# Patient Record
Sex: Male | Born: 1960 | Race: White | Hispanic: No | Marital: Married | Smoking: Never smoker
Health system: Southern US, Community
[De-identification: ages and names within clinical notes are randomized; demographics above are authoritative.]

## PROBLEM LIST (undated history)

## (undated) HISTORY — PX: RHINOPLASTY: SUR1284

---

## 2006-05-16 ENCOUNTER — Ambulatory Visit: Payer: Self-pay | Admitting: Gastroenterology

## 2007-03-15 ENCOUNTER — Encounter: Admission: RE | Admit: 2007-03-15 | Discharge: 2007-03-15 | Payer: Self-pay | Admitting: Orthopedic Surgery

## 2007-04-17 ENCOUNTER — Ambulatory Visit (HOSPITAL_COMMUNITY): Admission: RE | Admit: 2007-04-17 | Discharge: 2007-04-17 | Payer: Self-pay | Admitting: Orthopedic Surgery

## 2007-09-26 ENCOUNTER — Encounter: Admission: RE | Admit: 2007-09-26 | Discharge: 2007-09-26 | Payer: Self-pay | Admitting: Internal Medicine

## 2009-07-30 ENCOUNTER — Ambulatory Visit: Payer: Self-pay | Admitting: Diagnostic Radiology

## 2009-07-30 ENCOUNTER — Emergency Department (HOSPITAL_BASED_OUTPATIENT_CLINIC_OR_DEPARTMENT_OTHER): Admission: EM | Admit: 2009-07-30 | Discharge: 2009-07-31 | Payer: Self-pay | Admitting: Emergency Medicine

## 2010-05-02 LAB — URINE MICROSCOPIC-ADD ON

## 2010-05-02 LAB — COMPREHENSIVE METABOLIC PANEL
Alkaline Phosphatase: 69 U/L (ref 39–117)
CO2: 27 mEq/L (ref 19–32)
Calcium: 9.4 mg/dL (ref 8.4–10.5)
Chloride: 97 mEq/L (ref 96–112)
Sodium: 138 mEq/L (ref 135–145)
Total Bilirubin: 0.8 mg/dL (ref 0.3–1.2)
Total Protein: 7.2 g/dL (ref 6.0–8.3)

## 2010-05-02 LAB — CULTURE, BLOOD (ROUTINE X 2): Culture: NO GROWTH

## 2010-05-02 LAB — DIFFERENTIAL
Basophils Absolute: 0.1 10*3/uL (ref 0.0–0.1)
Eosinophils Relative: 0 % (ref 0–5)
Lymphs Abs: 0.6 10*3/uL — ABNORMAL LOW (ref 0.7–4.0)
Monocytes Relative: 7 % (ref 3–12)
Neutrophils Relative %: 86 % — ABNORMAL HIGH (ref 43–77)

## 2010-05-02 LAB — URINALYSIS, ROUTINE W REFLEX MICROSCOPIC
Bilirubin Urine: NEGATIVE
Glucose, UA: NEGATIVE mg/dL
Ketones, ur: 15 mg/dL — AB
Protein, ur: NEGATIVE mg/dL
pH: 7 (ref 5.0–8.0)

## 2010-05-02 LAB — CBC
Hemoglobin: 13.4 g/dL (ref 13.0–17.0)
RDW: 12.1 % (ref 11.5–15.5)

## 2010-05-02 LAB — ROCKY MTN SPOTTED FVR AB, IGM-BLOOD: RMSF IgM: 0.15 IV (ref 0.00–0.89)

## 2010-05-02 LAB — CK: Total CK: 411 U/L — ABNORMAL HIGH (ref 7–232)

## 2010-05-02 LAB — ROCKY MTN SPOTTED FVR AB, IGG-BLOOD: RMSF IgG: 0.08 IV

## 2010-05-02 LAB — URINE CULTURE

## 2010-06-29 NOTE — Op Note (Signed)
Corey Donaldson, Corey Donaldson                ACCOUNT NO.:  [id number]   MEDICAL RECORD NO.:  [id number]          PATIENT TYPE:  AMB   LOCATION:  SDS                          FACILITY:  [facility]   PHYSICIAN:  Almedia Balls. Ranell Patrick, M.D. DATE OF BIRTH:  11-21-1960   DATE OF PROCEDURE:  04/17/2007  DATE OF DISCHARGE:                               OPERATIVE REPORT   PREOPERATIVE DIAGNOSIS:  Right shoulder rotator cuff tear.   POSTOPERATIVE DIAGNOSES:  1. Right shoulder rotator cuff tear.  2. Right shoulder anterior labral tear.  3. Right shoulder impingement.   PROCEDURE:  Right shoulder arthroscopy with extensive intra-articular  debridement, including debridement of anterior labral tear, also  arthroscopic rotator interval release, followed by arthroscopic  subacromial decompression, mini-open rotator cuff repair.   SURGEON:  Almedia Balls. Ranell Patrick, M.D.   ASSISTANT:  Donnie Coffin. Dixon, P.A.-C.   ANESTHESIA:  General anesthesia plus an inter-Scalene block anesthesia  was used.   ESTIMATED BLOOD LOSS:  Minimal.   FLUIDS REPLACED:  Was 1500 mL of crystalloid.   INSTRUMENT COUNT:  Correct.   COMPLICATIONS:  None.   MEDICAL DECISION MAKING:  Perioperative antibiotics given.   INDICATIONS FOR PROCEDURE:  The patient is a 50 year old male with a  history of worsening right shoulder pain, who presents now with a  documented rotator cuff tear on MRI.  The patient presents now for  operative treatment, having failed conservative management.  An informed  consent was obtained.   DESCRIPTION OF PROCEDURE:  After an adequate level of anesthesia was  achieved, the patient was positioned in the modified beach chair  position.  All neurovascular structures were padded appropriately.  The  right shoulder was examined under anesthesia.  No induced stiffness was  noted, with a full passive range of motion of the shoulder.  Forward  flexion up to 180 degrees, abduction 95-100 degrees, external rotation  65  degrees, internal rotation about 45 degrees with the arm abducted.  Following the our examination under anesthesia, which showed no  instability, we went ahead and sterilely prepped and draped the right  shoulder in the usual manner.  We entered the shoulder arthroscopically  through the standard arthroscopic portals including anterior, posterior  and lateral portals. We identified hyperemia within the joint,  indicative of capsulitis.  We identified an anterior labral tear which  was debrided using a motorized shaver.  The remainder of the labrum was  intact, including anterior inferior labrum and the anterior band of the  inferior glenohumeral ligament.  The subscapularis is normal.  The  rotator cuff is torn.  This is a full thickness supraspinatus tear.  The  infraspinatus is normal.  The posterior portion of the cuff, including  the teres minor was normal.  The posterior labrum was intact.  There was  a lot of erythema but no significant thickening in the posterior  capsule.  I did not perform a capsule release.  Anteriorly we did  perform a rotator interval release due to some thickening in that  tissue.  At this point we placed the scope in the subacromial space  and  performed a thorough bursectomy and acromioplasty, creating a nice type  acromial shape.  We then completely decompressed the rotator cuff  outlet.  At this point we identified the rotator cuff tear from the  subacromial space, confirming the full thickness nature of the tear.  We  went ahead and concluded our arthroscopy and made a small mini-open  incision, starting at the anterior distal acromion and extending down  about 4 cm.  Dissection carried sharply out through subcutaneous  tissues.  We split the deltoid line with its fibers and the raphe  between the internal lateral heads.  We identified the torn rotator  cuff.  This was a U-shaped tear.  Immobilized it both in the bursal and  joint surfaces.  Placed a  margin convergent suture with #2 fiber wire  medially and then a single 5.5 Biocor screw anchor adjacent to the  articular cartilage and brought out the suture limbs through the  anterior posterior leaf of the tear.  Then repaired the rotator cuff  with first the margin convergent suture, then the two sutures.  They  were pre-loaded on the 5.5 Biocor screw anchor from [employer].  We had a  nice cuff repair.  We went ahead and placed two more fiber wire sutures  in a mattress fashion, to facilitate placement of push locks, to hold  down the lateral portion of the repair, and to apply that entire width  of the tendon against the freshened up greater tuberosity area, which we  did with a rongeur prior to placement of the anchor.  With these two  mattress sutures brought down over the side of the humerus, we were able  to use 4.5 bio-push locks out laterally, to further reinforce the  repair.  Thus we had an application of the medial portion of the foot  print and a lateral portion of the foot print or a double row-type  repair.  At this point we took the shoulder through a full range of  motion.  No impingement was noted.  We went ahead and thoroughly  irrigated and then closed the deltoid to itself with #0 Vicryl suture,  followed by #2-0 Vicryl subcutaneous closure and #4-0 Monocryl for skin  and Steri-Strips applied, followed by a sterile dressing.   The patient tolerated the surgery well.      Almedia Balls. Ranell Patrick, M.D.  Electronically Signed     SRN/MEDQ  D:  04/17/2007  T:  04/18/2007  Job:  [id number]

## 2010-07-02 NOTE — Assessment & Plan Note (Signed)
[facility]                         GASTROENTEROLOGY OFFICE NOTE   NAME:VESTALWilley, Due                       MRN:          [id number]  DATE:05/16/2006                            DOB:          [birth year]    REASON FOR CONSULTATION:  Dr. Alessandra Bevels asked me to evaluate Mr. Minotti in  consultation regarding right lower quadrant pain.   HISTORY OF PRESENT ILLNESS:  Mr. Ribaudo is a pleasant 50 year old man  who has had right lower quadrant pain for approximately six weeks.  He  describes them as a dull pain that tends to come and go throughout the  day, not related to eating, not related to moving his bowels.  Changing  positions does not seem to make the pain better or worse.  He has had no  nausea, vomiting, fever or chills.  He has had no constipation or rectal  bleeding.  No diarrhea.  He eventually presented to [facility] and had blood testing and imaging studies, including a CT  scan and some laboratory tests.  He tells me that these were all  essentially normal, except for a minor elevation in his liver tests.  (This has been a chronic issue for him.)  None of these tests are  available for our review here today.  We have called [facility] and they  will fax them as soon as possible.  He says he also had some blood tests  done by his primary care physician.  These tests are not available  either.  We are working to get those sent over as well.  The last blood  tests I have from him are from a complete metabolic profile in July  2007, which showed an ALT of 55, otherwise essentially normal.  A CBC  one year ago was normal as well.   REVIEW OF SYSTEMS:  Essentially negative and is available on his nursing  intake sheet.   PAST MEDICAL/SURGICAL HISTORY:  1. Left knee surgery.  2. Wisdom teeth pulled.  3. Elevated cholesterol.   CURRENT MEDICATIONS:  1. Crestor.  2. Testosterone.  3. Omega fish oil.  4. Magnesium.  5.  Cerefolin.  6. Zinc.  7. Iodine.  8. Silymarin.  9. Arimidex.  10.Aspirin.  11.Multivitamin.   ALLERGIES:  No known drug allergies.   SOCIAL HISTORY:  Married with one daughter.  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  Nonsmoker.  Drinks one cup of  wine a day.   PHYSICAL EXAMINATION:  VITAL SIGNS:  Height 5 feet 8 inches, weight 175  pounds, blood pressure 120/70, pulse 68.  GENERAL:  Well-appearing.  NEUROLOGIC:  Alert and oriented x3.  HEENT:  Eyes:  Extraocular movements intact.  Mouth:  Oropharynx moist,  no lesions.  NECK:  Supple, no lymphadenopathy.  CARDIOVASCULAR:  A regular rate and rhythm.  LUNGS:  Clear to auscultation bilaterally.  ABDOMEN:  Soft, nontender, non-distended.  Normal bowel sounds.  EXTREMITIES:  No lower extremity edema.  SKIN:  No rashes, lesions of the extremities.   ASSESSMENT/PLAN:  A 50 year old  man with right lower quadrant discomfort   Recent laboratory tests and imaging studies were normal, according to  the patient.  We are working to get these sent over from his primary  care physician in [facility].  I do, however, trust  that they were normal for now, especially since on examination he is  only very, very mildly tender in the right lower quadrant.  He has had  no fever or chills, to suggest a significant infection or inflammation.  He has had no change in his bowels to suggest a primary GI inflammatory  condition as well.  He is on many over-the-counter medicines, most of  which I am not familiar with their side effect profile.  Arimidex is  known to cause pains, arthritis and arthralgias,  and that may be  perhaps contributing to some of his symptoms now.  I would not be able  to explain why it is helping now, when he has been on the medicine for  at least one year or so.  I recommended he consider cutting back on some  of these, unless they are absolutely necessary.  He will discuss  this  with his primary care physician.  I will review the laboratory tests and  imaging studies as they are sent here.  He will return to see me in two  to three weeks' time.  He knows to contact me if anything has changed,  so I think my first plan is that if his pain dramatically increases,  will repeat the CT scan, and to get a new set of CBC and complete  metabolic profile performed.  Otherwise I think you should just continue  his usual routine besides perhaps cutting back on some non-essential  medicines.     Rachael Fee, MD  Electronically Signed    DPJ/MedQ  DD: 05/16/2006  DT: 05/16/2006  Job #: [id number]

## 2010-11-08 LAB — BASIC METABOLIC PANEL
BUN: 13
CO2: 30
Calcium: 10.1
Chloride: 105
Creatinine, Ser: 1.05
Glucose, Bld: 99
Potassium: 4
Sodium: 142

## 2010-11-08 LAB — DIFFERENTIAL
Basophils Absolute: 0
Basophils Relative: 0
Eosinophils Relative: 1
Lymphocytes Relative: 24
Monocytes Absolute: 0.4
Monocytes Relative: 10

## 2010-11-08 LAB — CBC
HCT: 46.6
Platelets: 201
WBC: 4.4

## 2010-11-08 LAB — APTT: aPTT: 29

## 2010-11-08 LAB — ABO/RH: ABO/RH(D): A POS

## 2010-11-08 LAB — TYPE AND SCREEN
ABO/RH(D): A POS
Antibody Screen: NEGATIVE

## 2010-11-08 LAB — URINALYSIS, ROUTINE W REFLEX MICROSCOPIC: Specific Gravity, Urine: 1.026

## 2013-08-21 ENCOUNTER — Ambulatory Visit
Admission: RE | Admit: 2013-08-21 | Discharge: 2013-08-21 | Disposition: A | Discharge status: Home / Self Care | Payer: PRIVATE HEALTH INSURANCE | Source: Ambulatory Visit | Attending: Internal Medicine | Admitting: Internal Medicine

## 2013-08-21 ENCOUNTER — Other Ambulatory Visit: Payer: Self-pay | Admitting: Internal Medicine

## 2013-08-21 DIAGNOSIS — M25512 Pain in left shoulder: Secondary | ICD-10-CM

## 2013-09-16 ENCOUNTER — Other Ambulatory Visit: Payer: Self-pay | Admitting: Internal Medicine

## 2013-09-16 DIAGNOSIS — M25512 Pain in left shoulder: Secondary | ICD-10-CM

## 2013-09-26 ENCOUNTER — Ambulatory Visit
Admission: RE | Admit: 2013-09-26 | Discharge: 2013-09-26 | Disposition: A | Discharge status: Home / Self Care | Payer: PRIVATE HEALTH INSURANCE | Source: Ambulatory Visit | Attending: Internal Medicine | Admitting: Internal Medicine

## 2013-09-26 DIAGNOSIS — M25512 Pain in left shoulder: Secondary | ICD-10-CM

## 2013-09-26 MED ORDER — IOHEXOL 180 MG/ML  SOLN: 15.0000 mL | Freq: Once | INTRAMUSCULAR | Status: AC | PRN

## 2014-04-01 ENCOUNTER — Ambulatory Visit (INDEPENDENT_AMBULATORY_CARE_PROVIDER_SITE_OTHER): Payer: PRIVATE HEALTH INSURANCE | Admitting: Family Medicine

## 2014-04-01 ENCOUNTER — Encounter: Payer: Self-pay | Admitting: Family Medicine

## 2014-04-01 ENCOUNTER — Ambulatory Visit (INDEPENDENT_AMBULATORY_CARE_PROVIDER_SITE_OTHER)
Admission: RE | Admit: 2014-04-01 | Discharge: 2014-04-01 | Disposition: A | Discharge status: Home / Self Care | Payer: PRIVATE HEALTH INSURANCE | Source: Ambulatory Visit | Attending: Family Medicine | Admitting: Family Medicine

## 2014-04-01 ENCOUNTER — Other Ambulatory Visit (INDEPENDENT_AMBULATORY_CARE_PROVIDER_SITE_OTHER): Payer: PRIVATE HEALTH INSURANCE

## 2014-04-01 VITALS — BP 142/86 | HR 95 | Ht 68.0 in | Wt 187.0 lb

## 2014-04-01 DIAGNOSIS — R0781 Pleurodynia: Secondary | ICD-10-CM

## 2014-04-01 DIAGNOSIS — S20212A Contusion of left front wall of thorax, initial encounter: Secondary | ICD-10-CM | POA: Insufficient documentation

## 2014-04-01 MED ORDER — HYDROCODONE-ACETAMINOPHEN 7.5-325 MG PO TABS: 1.0000 | ORAL_TABLET | Freq: Three times a day (TID) | ORAL | Status: DC | PRN

## 2014-04-01 NOTE — Progress Notes (Signed)
  Tawana ScaleZach Smith D.O. [facility] [address] [place], [place] [address] Phone: [phone] Subjective:    I'm seeing this patient by the request  of:  Dr. Alessandra BevelsVaughn  CC: fell and hurt ribs.    BJY:NWGNFAOZHYHPI:Subjective Corey Donaldson is a 54 y.o. male coming in with complaint of rib pain after fall. Patient did slip on the ice. Patient fell onto his left side. Patient had some difficulty breathing.  Patient does state that this happened last night. With going upstairs and fell directly on his left side. Patient states that he was a significant amount of pain immediately. Patient has been doing on a could gel and taking some over-the-counter anti-inflammatories. Patient states that the pain is tolerable but very difficult to sleep. Patient only slept approximate 1-2 hours at night. Denies any bruising states the pain seems to be mostly in the midportion around to the anterior aspect with less pain on the posterior aspect.    patient did have x-rays of the left side of his ribs. Patient's x-rays of the wrist do not show any significant bony abnormality with over read pending.  Past medical history, social, surgical and family history all reviewed in electronic medical record.   Review of Systems: No headache, visual changes, nausea, vomiting, diarrhea, constipation, dizziness, abdominal pain, skin rash, fevers, chills, night sweats, weight loss, swollen lymph nodes, body aches, joint swelling, muscle aches, chest pain, shortness of breath, mood changes.   Objective There were no vitals taken for this visit.  General: No apparent distress alert and oriented x3 mood and affect normal, dressed appropriately.  HEENT: Pupils equal, extraocular movements intact  Respiratory: Patient's speak in full sentences and does not appear short of breath  Cardiovascular: No lower extremity edema, non tender, no erythema  Skin: Warm dry intact with no signs of infection or rash on extremities or on axial skeleton.   Abdomen: Soft nontender  Neuro: Cranial nerves II through XII are intact, neurovascularly intact in all extremities with 2+ DTRs and 2+ pulses.  Lymph: No lymphadenopathy of posterior or anterior cervical chain or axillae bilaterally.  Gait normal with good balance and coordination.  MSK:  Non tender with full range of motion and good stability and symmetric strength and tone of shoulders, elbows, wrist, hip, knee and ankles bilaterally.  Rib exam shows  very minimal dislocation but no significant bony abnormality noted of the left side rib cage. Patient though is tender to palpation mostly over the ninth through 11th rib mostly on the axillary line. No crepitus noted. No significant bruising or skin changes noted. Neurovascularly intact surrounding the area with no signs or rash. No flank pain or bruising noted.  Limited musculoskeletal ultrasound was performed and interpreted by Antoine PrimasSMITH, ZACHARY, M  Limited ultrasound shows the patient is having no bony abnormality noted. Patient has some mild hypoechoic changes of the soft tissue in the area. Impression: rib contusion with no break.      Impression and Recommendations:     This case required medical decision making of moderate complexity.

## 2014-04-01 NOTE — Patient Instructions (Addendum)
Good to meet you You are going to be in pain for about 1-2 weeks.  Ice 20 minutes 4-5 times daily.  Try the topical medicine up to 2 times daily.  Take 10 deep breaths every 2 hours.  We will try a rib brace but you need to do the breathing.  Pennsaid twice daily Norco if you need it.  See me again in 1-2 weeks.

## 2014-04-01 NOTE — Assessment & Plan Note (Signed)
No fracture seen Put in rib belt Topical NSAIDs Pain medicines given in case of breakthrough Discussed deep breathing exercises Patient will return in 10 days to make sure he is improving significant doing.

## 2014-04-01 NOTE — Progress Notes (Signed)
Pre visit review using our clinic review tool, if applicable. No additional management support is needed unless otherwise documented below in the visit note. 

## 2014-04-10 ENCOUNTER — Encounter: Payer: Self-pay | Admitting: Family Medicine

## 2014-04-10 ENCOUNTER — Ambulatory Visit (INDEPENDENT_AMBULATORY_CARE_PROVIDER_SITE_OTHER): Payer: PRIVATE HEALTH INSURANCE | Admitting: Family Medicine

## 2014-04-10 VITALS — BP 140/84 | HR 108 | Ht 68.0 in | Wt 184.0 lb

## 2014-04-10 DIAGNOSIS — S20212D Contusion of left front wall of thorax, subsequent encounter: Secondary | ICD-10-CM

## 2014-04-10 DIAGNOSIS — M67912 Unspecified disorder of synovium and tendon, left shoulder: Secondary | ICD-10-CM | POA: Insufficient documentation

## 2014-04-10 MED ORDER — NITROGLYCERIN 0.2 MG/HR TD PT24: MEDICATED_PATCH | TRANSDERMAL | Status: DC

## 2014-04-10 NOTE — Progress Notes (Signed)
Pre visit review using our clinic review tool, if applicable. No additional management support is needed unless otherwise documented below in the visit note. 

## 2014-04-10 NOTE — Assessment & Plan Note (Addendum)
She does have some rotator cuff tendinopathy overall. I do think that actually icing regimen and home exercises will be beneficial with patient has already been doing. Patient will be started on nitroglycerin patches. Patient continues to have difficulty we can repeat PRP. We will discuss with patient again in 3 weeks to make sure that he is doing relatively well with no side effects to the treatment.  Spent  25 minutes with patient face-to-face and had greater than 50% of counseling including as described above in assessment and plan.

## 2014-04-10 NOTE — Progress Notes (Signed)
  Tawana ScaleZach Glenden Rossell D.O. [facility] [address] [place], [place] [address] Phone: [phone]([phone] Subjective:     CC: fell and hurt ribs.    BJY:NWGNFAOZHYHPI:Subjective Corey D Lance MorinVestal is a 54 y.o. male coming in with complaint of rib pain after fall. Patient did slip on the ice multiple weeks ago. Patient was seen previously and had more of a rib contusion with a questionable 10th rib fracture noted on x-ray. Patient states rib is feeling better, still uncomfortable. No radiation decrease pain meds to nothing in last 2 days.   Patient is also complaining of left shoulder pain. Patient did have a rotator cuff tear that has been treated conservatively. Patient has had 4 rounds of PRP injection already. Patient states after this fall unfortunately is having worsening pain again. Denies any weakness though. Patient patient denies any radiation. Rates the severity of pain up proximally 4 out of 10. Patient is not doing any increase activity secondary to the pain.     patient did have x-rays of the left side of his ribs. Patient's x-rays of the wrist do not show any significant bony abnormality with over read pending.  Past medical history, social, surgical and family history all reviewed in electronic medical record.   Review of Systems: No headache, visual changes, nausea, vomiting, diarrhea, constipation, dizziness, abdominal pain, skin rash, fevers, chills, night sweats, weight loss, swollen lymph nodes, body aches, joint swelling, muscle aches, chest pain, shortness of breath, mood changes.   Objective Blood pressure 140/84, pulse 108, height 5\' 8"  (1.727 m), weight 184 lb (83.462 kg), SpO2 97 %.  General: No apparent distress alert and oriented x3 mood and affect normal, dressed appropriately.  HEENT: Pupils equal, extraocular movements intact  Respiratory: Patient's speak in full sentences and does not appear short of breath  Cardiovascular: No lower extremity edema, non tender, no erythema  Skin:  Warm dry intact with no signs of infection or rash on extremities or on axial skeleton.  Abdomen: Soft nontender  Neuro: Cranial nerves II through XII are intact, neurovascularly intact in all extremities with 2+ DTRs and 2+ pulses.  Lymph: No lymphadenopathy of posterior or anterior cervical chain or axillae bilaterally.  Gait normal with good balance and coordination.  MSK:  Non tender with full range of motion and good stability and symmetric strength and tone of  elbows, wrist, hip, knee and ankles bilaterally.  Rib exam shows patient is still somewhat tender over the 10th rib on the left side. There is no crepitus noted. Significant improvement from previous. No discoloration noted. Shoulder: Left Inspection reveals no abnormalities, atrophy or asymmetry. Palpation is normal with no tenderness over AC joint or bicipital groove. ROM is full in all planes. Rotator cuff strength 4 out of 5 compared to 5 out of 5 on the contralateral side Positive signs of impingement Speeds and Yergason's tests normal. No labral pathology noted with negative Obrien's, negative clunk and good stability. Normal scapular function observed. No painful arc and no drop arm sign. No apprehension sign           Impression and Recommendations:     This case required medical decision making of moderate complexity.

## 2014-04-10 NOTE — Assessment & Plan Note (Signed)
Patient is doing very well with the healing of the rib and is still approximately 2 weeks out from being pain-free. Discussed continuing the icing regimen as well as has pain medications when needed. Patient will start to increase his activity slowly over the course of the next 2 weeks.

## 2014-04-10 NOTE — Patient Instructions (Addendum)
Good to see you Ice is your friend still  I think you are a couple weeks away form being pain free Nitroglycerin Protocol   Apply 1/4 nitroglycerin patch to affected area daily.  Change position of patch within the affected area every 24 hours.  You may experience a headache during the first 1-2 weeks of using the patch, these should subside.  If you experience headaches after beginning nitroglycerin patch treatment, you may take your preferred over the counter pain reliever.  Another side effect of the nitroglycerin patch is skin irritation or rash related to patch adhesive.  Please notify our office if you develop more severe headaches or rash, and stop the patch.  Tendon healing with nitroglycerin patch may require 12 to 24 weeks depending on the extent of injury.  Men should not use if taking Viagra, Cialis, or Levitra.   Do not use if you have migraines or rosacea.   Check in  3 weeks.

## 2014-04-28 ENCOUNTER — Ambulatory Visit (INDEPENDENT_AMBULATORY_CARE_PROVIDER_SITE_OTHER): Payer: PRIVATE HEALTH INSURANCE | Admitting: Family Medicine

## 2014-04-28 ENCOUNTER — Encounter: Payer: Self-pay | Admitting: Family Medicine

## 2014-04-28 VITALS — BP 136/82 | HR 87 | Ht 68.0 in | Wt 184.0 lb

## 2014-04-28 DIAGNOSIS — M24559 Contracture, unspecified hip: Secondary | ICD-10-CM

## 2014-04-28 DIAGNOSIS — M9902 Segmental and somatic dysfunction of thoracic region: Secondary | ICD-10-CM

## 2014-04-28 DIAGNOSIS — M9903 Segmental and somatic dysfunction of lumbar region: Secondary | ICD-10-CM

## 2014-04-28 DIAGNOSIS — S20212D Contusion of left front wall of thorax, subsequent encounter: Secondary | ICD-10-CM

## 2014-04-28 DIAGNOSIS — M999 Biomechanical lesion, unspecified: Secondary | ICD-10-CM

## 2014-04-28 DIAGNOSIS — M9904 Segmental and somatic dysfunction of sacral region: Secondary | ICD-10-CM

## 2014-04-28 DIAGNOSIS — S298XXD Other specified injuries of thorax, subsequent encounter: Secondary | ICD-10-CM

## 2014-04-28 DIAGNOSIS — M67912 Unspecified disorder of synovium and tendon, left shoulder: Secondary | ICD-10-CM

## 2014-04-28 MED ORDER — NITROGLYCERIN 0.2 MG/HR TD PT24: MEDICATED_PATCH | TRANSDERMAL | Status: DC

## 2014-04-28 NOTE — Assessment & Plan Note (Signed)
Doing well, continue nitro

## 2014-04-28 NOTE — Progress Notes (Signed)
Tawana ScaleZach Smith D.O. [facility] [address] [place], [place] [address] Phone: [phone] Subjective:     CC: fell and hurt ribs.    BJY:NWGNFAOZHYHPI:Subjective Corey D Lance MorinVestal is a 54 y.o. male coming in with complaint of rib pain after fall. Patient did slip on the ice multiple weeks ago. Patient was seen previously and had more of a rib contusion with a questionable 10th rib fracture noted on x-ray. Patient states that overall he is doing significantly better. Not affecting his daily activities.  Patient is also complaining of left shoulder pain. Patient did have a rotator cuff tear that has been treated conservatively. Patient has had 4 rounds of PRP injection already. Patient states after this fall unfortunately is having worsening pain again.  Patient was given the nitroglycerin patches. Patient has not notice any significant improvement but it Diffley has not got worse which patient is happy with. Patient denies any numbness or tingling down the arm.  Mild lower back pain. Patient states it is more of a dull throbbing aching pain. Denies any radiation down the legs. Patient has had this trouble for quite some time and has noticed though that last 2 days gotten worse can see was doing a lot of yard work.     patient did have x-rays of the left side of his ribs. Patient's x-rays of the wrist do not show any significant bony abnormality with over read pending.  Past medical history, social, surgical and family history all reviewed in electronic medical record.   Review of Systems: No headache, visual changes, nausea, vomiting, diarrhea, constipation, dizziness, abdominal pain, skin rash, fevers, chills, night sweats, weight loss, swollen lymph nodes, body aches, joint swelling, muscle aches, chest pain, shortness of breath, mood changes.   Objective Blood pressure 136/82, pulse 87, height 5\' 8"  (1.727 m), weight 184 lb (83.462 kg), SpO2 98 %.  General: No apparent distress alert and  oriented x3 mood and affect normal, dressed appropriately.  HEENT: Pupils equal, extraocular movements intact  Respiratory: Patient's speak in full sentences and does not appear short of breath  Cardiovascular: No lower extremity edema, non tender, no erythema  Skin: Warm dry intact with no signs of infection or rash on extremities or on axial skeleton.  Abdomen: Soft nontender  Neuro: Cranial nerves II through XII are intact, neurovascularly intact in all extremities with 2+ DTRs and 2+ pulses.  Lymph: No lymphadenopathy of posterior or anterior cervical chain or axillae bilaterally.  Gait normal with good balance and coordination.  MSK:  Non tender with full range of motion and good stability and symmetric strength and tone of  elbows, wrist, hip, knee and ankles bilaterally.  Rib exam shows patient is still somewhat tender over the 10th rib on the left side. There is no crepitus noted. Significant improvement from previous. No discoloration noted. Shoulder: Left Inspection reveals no abnormalities, atrophy or asymmetry. Palpation is normal with no tenderness over AC joint or bicipital groove. ROM is full in all planes. Rotator cuff strength 4 out of 5 compared to 5 out of 5 on the contralateral side Mild impingement sign still Speeds and Yergason's tests normal. No labral pathology noted with negative Obrien's, negative clunk and good stability. Normal scapular function observed. No painful arc and no drop arm sign. No apprehension sign   Back Exam:  Inspection: Unremarkable  Motion: Flexion 45 deg, Extension 45 deg, Side Bending to 45 deg bilaterally,  Rotation to 45 deg bilaterally  SLR laying: Negative  XSLR laying: Negative  Palpable tenderness: None. FABER: negative. Sensory change: Gross sensation intact to all lumbar and sacral dermatomes.  Reflexes: 2+ at both patellar tendons, 2+ at achilles tendons, Babinski's downgoing.  Strength at foot  Plantar-flexion: 5/5  Dorsi-flexion: 5/5 Eversion: 5/5 Inversion: 5/5  Leg strength  Quad: 5/5 Hamstring: 5/5 Hip flexor: 5/5 Hip abductors: 5/5  Gait unremarkable.   Osteophytic findings T3 extended rotated and side bent right  T8 extended rotated and side bent left  L2 flexed rotated inside that right  Sacrum left on left     Impression and Recommendations:     This case required medical decision making of moderate complexity.

## 2014-04-28 NOTE — Assessment & Plan Note (Signed)
Near healed

## 2014-04-28 NOTE — Progress Notes (Signed)
Pre visit review using our clinic review tool, if applicable. No additional management support is needed unless otherwise documented below in the visit note. 

## 2014-04-28 NOTE — Assessment & Plan Note (Signed)
Patient's back pain is secondary to his tight hip flexors. We discussed icing regimen and home exercises. Does icing and will do the home exercises. Patient will come back in 3 weeks for further evaluation. Patient did respond well to osteopathic manipulation.

## 2014-04-28 NOTE — Patient Instructions (Signed)
Good to see you Ice is your friend The rib is good to go Continue the patch daily still for another 2 weeks For your back consider some stretches Ice is still good See me again in 3 weeks if the manipulation helps

## 2014-05-29 ENCOUNTER — Ambulatory Visit (INDEPENDENT_AMBULATORY_CARE_PROVIDER_SITE_OTHER): Payer: PRIVATE HEALTH INSURANCE | Admitting: Family Medicine

## 2014-05-29 ENCOUNTER — Encounter: Payer: Self-pay | Admitting: Family Medicine

## 2014-05-29 VITALS — BP 152/98 | HR 100 | Ht 68.0 in | Wt 183.0 lb

## 2014-05-29 DIAGNOSIS — M24559 Contracture, unspecified hip: Secondary | ICD-10-CM | POA: Diagnosis not present

## 2014-05-29 DIAGNOSIS — M67912 Unspecified disorder of synovium and tendon, left shoulder: Secondary | ICD-10-CM | POA: Diagnosis not present

## 2014-05-29 DIAGNOSIS — M9903 Segmental and somatic dysfunction of lumbar region: Secondary | ICD-10-CM | POA: Diagnosis not present

## 2014-05-29 DIAGNOSIS — M9902 Segmental and somatic dysfunction of thoracic region: Secondary | ICD-10-CM

## 2014-05-29 DIAGNOSIS — M999 Biomechanical lesion, unspecified: Secondary | ICD-10-CM

## 2014-05-29 DIAGNOSIS — M9904 Segmental and somatic dysfunction of sacral region: Secondary | ICD-10-CM | POA: Diagnosis not present

## 2014-05-29 NOTE — Patient Instructions (Addendum)
Good to see you Conitnue the exercises Stop the selenium Continue the nitro See me when you need it

## 2014-05-29 NOTE — Progress Notes (Signed)
[name] [address] [place], [place] [address] Phone: [phone] Subjective:     CC:  back pain follow   LKG:[id number] Corey Donaldson is a 54 y.o. male coming in follow-up of low back pain. Patient had more muscle imbalances as well as tight hip flexors. Patient was given home exercises and did respond well to osteopathic manipulation. Patient states  Patient is also complaining of left shoulder pain. Patient did have a rotator cuff tear that has been treated conservatively. Patient has had 4 rounds of PRP injection already. Patient continue with the nitroglycerin as well as the home exercises. Patient states continues to do better. Patient is looking forward to possibly lifting at this time.  Mild lower back pain. Patient states it is more of a dull throbbing aching pain. Denies any radiation down the legs. Patient has had this trouble for quite some time and has noticed though that last 2 days gotten worse can see was doing a lot of yard work.      Past medical history, social, surgical and family history all reviewed in electronic medical record.   Review of Systems: No headache, visual changes, nausea, vomiting, diarrhea, constipation, dizziness, abdominal pain, skin rash, fevers, chills, night sweats, weight loss, swollen lymph nodes, body aches, joint swelling, muscle aches, chest pain, shortness of breath, mood changes.   Objective Blood pressure 152/98, pulse 100, height  (1.727 m), weight 183 lb (83.008 kg), SpO2 99 %.  General: No apparent distress alert and oriented x3 mood and affect normal, dressed appropriately.  HEENT: Pupils equal, extraocular movements intact  Respiratory: Patient's speak in full sentences and does not appear short of breath  Cardiovascular: No lower extremity edema, non tender, no erythema  Skin: Warm dry intact with no signs of infection or rash on extremities or on axial skeleton.  Abdomen: Soft nontender    Neuro: Cranial nerves II through XII are intact, neurovascularly intact in all extremities with 2+ DTRs and 2+ pulses.  Lymph: No lymphadenopathy of posterior or anterior cervical chain or axillae bilaterally.  Gait normal with good balance and coordination.  MSK:  Non tender with full range of motion and good stability and symmetric strength and tone of  elbows, wrist, hip, knee and ankles bilaterally.   Shoulder: Left Inspection reveals no abnormalities, atrophy or asymmetry. Palpation is normal with no tenderness over AC joint or bicipital groove. ROM is full in all planes. Rotator cuff strength 5 out of 5 compared to 5 out of 5 on the contralateral sidethis is an improvement Mild impingement sign still Speeds and Yergason's tests normal. No labral pathology noted with negative Obrien's, negative clunk and good stability. Normal scapular function observed. No painful arc and no drop arm sign. No apprehension sign   Back Exam:  Inspection: Unremarkable  Motion: Flexion 45 deg, Extension 45 deg, Side Bending to 45 deg bilaterally,  Rotation to 45 deg bilaterally  SLR laying: Negative  XSLR laying: Negative  Palpable tenderness: None. FABER: negative. Sensory change: Gross sensation intact to all lumbar and sacral dermatomes.  Reflexes: 2+ at both patellar tendons, 2+ at achilles tendons, Babinski's downgoing.  Strength at foot  Plantar-flexion: 5/5 Dorsi-flexion: 5/5 Eversion: 5/5 Inversion: 5/5  Leg strength  Quad: 5/5 Hamstring: 5/5 Hip flexor: 5/5 Hip abductors: 5/5  Gait unremarkable.   Osteophytic findings Cervical C2 flexed rotated and side bent right  T3 extended rotated and side bent right  T8 extended rotated and side  bent left  L2 flexed rotated inside that right  Sacrum left on left Same as previous    Impression and Recommendations:     This case required medical decision making of moderate complexity.

## 2014-05-29 NOTE — Assessment & Plan Note (Signed)
He is doing much better at this time. Encourage patient to continue the home exercises and a regular basis and start becoming more active. Patient will try to make these back and see me again on an as-needed basis.

## 2014-05-29 NOTE — Progress Notes (Signed)
Pre visit review using our clinic review tool, if applicable. No additional management support is needed unless otherwise documented below in the visit note. 

## 2014-05-29 NOTE — Assessment & Plan Note (Signed)
Decision today to treat with OMT was based on Physical Exam  After verbal consent patient was treated with HVLA, ME techniques in cervical, thoracic, lumbar and sacral areas  Patient tolerated the procedure well with improvement in symptoms  Patient given exercises, stretches and lifestyle modifications  See medications in patient instructions if given  Patient will follow up in prn

## 2014-05-29 NOTE — Assessment & Plan Note (Addendum)
Significantly better than patient will continue with nitroglyceringlycerin patches. We discussed home exercises and lifting mechanics. Patient started increase activity as tolerated.

## 2014-06-02 ENCOUNTER — Encounter: Payer: Self-pay | Admitting: Family Medicine

## 2018-03-09 ENCOUNTER — Emergency Department (HOSPITAL_COMMUNITY)
Admission: EM | Admit: 2018-03-09 | Discharge: 2018-03-09 | Disposition: A | Discharge status: Home / Self Care | Payer: PRIVATE HEALTH INSURANCE | Attending: Emergency Medicine | Admitting: Emergency Medicine

## 2018-03-09 ENCOUNTER — Encounter (HOSPITAL_COMMUNITY): Payer: Self-pay | Admitting: *Deleted

## 2018-03-09 ENCOUNTER — Other Ambulatory Visit: Payer: Self-pay

## 2018-03-09 DIAGNOSIS — Z5321 Procedure and treatment not carried out due to patient leaving prior to being seen by health care provider: Secondary | ICD-10-CM | POA: Insufficient documentation

## 2018-03-09 DIAGNOSIS — R040 Epistaxis: Secondary | ICD-10-CM | POA: Diagnosis not present

## 2018-03-09 NOTE — ED Triage Notes (Signed)
Pt sent here for continued bleeding after septo-rhinoplasty on 1/14. Pressures have been elevated per surgeon's office.

## 2019-01-08 ENCOUNTER — Ambulatory Visit (INDEPENDENT_AMBULATORY_CARE_PROVIDER_SITE_OTHER): Payer: Managed Care, Other (non HMO) | Admitting: Family Medicine

## 2019-01-08 ENCOUNTER — Other Ambulatory Visit: Payer: Self-pay

## 2019-01-08 ENCOUNTER — Ambulatory Visit: Payer: Self-pay

## 2019-01-08 VITALS — BP 140/92 | HR 98 | Ht 68.0 in | Wt 183.0 lb

## 2019-01-08 DIAGNOSIS — M25512 Pain in left shoulder: Secondary | ICD-10-CM | POA: Diagnosis not present

## 2019-01-08 DIAGNOSIS — S46012A Strain of muscle(s) and tendon(s) of the rotator cuff of left shoulder, initial encounter: Secondary | ICD-10-CM | POA: Diagnosis not present

## 2019-01-08 MED ORDER — NITROGLYCERIN 0.2 MG/HR TD PT24: MEDICATED_PATCH | TRANSDERMAL | 0 refills | Status: DC

## 2019-01-08 NOTE — Patient Instructions (Addendum)
Nitroglycerin Protocol   Apply 1/4 nitroglycerin patch to affected area daily.  Change position of patch within the affected area every 24 hours.  You may experience a headache during the first 1-2 weeks of using the patch, these should subside.  If you experience headaches after beginning nitroglycerin patch treatment, you may take your preferred over the counter pain reliever.  Another side effect of the nitroglycerin patch is skin irritation or rash related to patch adhesive.  Please notify our office if you develop more severe headaches or rash, and stop the patch.  Tendon healing with nitroglycerin patch may require 12 to 24 weeks depending on the extent of injury.  Men should not use if taking Viagra, Cialis, or Levitra.   Do not use if you have migraines or rosacea.  Duexis 3x a day for next 6 days No push ups for 30 days See me again in 4 weeks

## 2019-01-08 NOTE — Progress Notes (Signed)
[name] [address] [place], [place] [address] Phone: [phone] Subjective:   [name], am serving as a scribe for Dr. Antoine Primas.  This visit occurred during the SARS-CoV-2 public health emergency.  Safety protocols were in place, including screening questions prior to the visit, additional usage of staff PPE, and extensive cleaning of exam room while observing appropriate contact time as indicated for disinfecting solutions.    CC: Left shoulder pain  MGQ:[id number]  Corey Donaldson is a 58 y.o. male coming in with complaint of left shoulder pain. Last seen in 2016 for hip flexor tightness. Patient states that he woke up at 3:30am and decided to do 100 push ups. Was pushing up with arms extended overhead. Patient heard a pop at #85. Pain over anterior shoulder with abduction and IR. History of previous shoulder injury years ago. Denies any radiating symptoms. Has been icing and using turmeric and increased his water intake. Has also tried DMSO and magnesium driven by [place]. Also has tried cupping and infrared therapy. Notes improvement since Saturday.      No past medical history on file. Past Surgical History:  Procedure Laterality Date  . RHINOPLASTY     Social History   Socioeconomic History  . Marital status: Married    Spouse name: Not on file  . Number of children: Not on file  . Years of education: Not on file  . Highest education level: Not on file  Occupational History  . Not on file  Social Needs  . Financial resource strain: Not on file  . Food insecurity    Worry: Not on file    Inability: Not on file  . Transportation needs    Medical: Not on file    Non-medical: Not on file  Tobacco Use  . Smoking status: Never Smoker  Substance and Sexual Activity  . Alcohol use: Not on file  . Drug use: Not on file  . Sexual activity: Not on file  Lifestyle  . Physical activity    Days per week: Not on file    Minutes per  session: Not on file  . Stress: Not on file  Relationships  . Social Musician on phone: Not on file    Gets together: Not on file    Attends religious service: Not on file    Active member of club or organization: Not on file    Attends meetings of clubs or organizations: Not on file    Relationship status: Not on file  Other Topics Concern  . Not on file  Social History Narrative  . Not on file   Allergies  Allergen Reactions  . Penicillins    No family history on file.   Current Outpatient Medications (Cardiovascular):  .  lisinopril (PRINIVIL,ZESTRIL) 10 MG tablet, Take 10 mg by mouth daily. .  nitroGLYCERIN (NITRODUR - DOSED IN MG/24 HR) 0.2 mg/hr patch, 1/4 patch daily .  rosuvastatin (CRESTOR) 20 MG tablet, Take 20 mg by mouth daily. .  nitroGLYCERIN (NITRO-DUR) 0.2 mg/hr patch, Apply 1/4 of a patch to skin once daily.   Current Outpatient Medications (Analgesics):  .  HYDROcodone-acetaminophen (NORCO) 7.5-325 MG per tablet, Take 1 tablet by mouth every 8 (eight) hours as needed for moderate pain (cough).      Past medical history, social, surgical and family history all reviewed in electronic medical record.  No pertanent information unless stated regarding to the chief complaint.  Review of Systems:  No headache, visual changes, nausea, vomiting, diarrhea, constipation, dizziness, abdominal pain, skin rash, fevers, chills, night sweats, weight loss, swollen lymph nodes, body aches, joint swelling, muscle aches, chest pain, shortness of breath, mood changes.   Objective  Blood pressure (!) 140/92, pulse 98, height 5\' 8"  (1.727 m), weight 183 lb (83 kg), SpO2 98 %. Systems examined below as of    General: No apparent distress alert and oriented x3 mood and affect normal, dressed appropriately.  HEENT: Pupils equal, extraocular movements intact  Respiratory: Patient's speak in full sentences and does not appear short of breath  Cardiovascular: No  lower extremity edema, non tender, no erythema  Skin: Warm dry intact with no signs of infection or rash on extremities or on axial skeleton.  Abdomen: Soft nontender  Neuro: Cranial nerves II through XII are intact, neurovascularly intact in all extremities with 2+ DTRs and 2+ pulses.  Lymph: No lymphadenopathy of posterior or anterior cervical chain or axillae bilaterally.  Gait normal with good balance and coordination.  MSK:  Non tender with full range of motion and good stability and symmetric strength and tone of  elbows, wrist, hip, knee and ankles bilaterally.  Left shoulder exam shows that patient does have decreased active range of motion in all planes.  Patient does have a positive drop arm sign.  Pain with empty can.  Mild weakness of the rotator cuff 4 out of 5 compared to the contralateral side.  Negative crossover sign.  Neurovascularly intact distally.  Contralateral shoulder unremarkable  Limited musculoskeletal ultrasound was performed and interpreted by Lyndal Pulley  Limited ultrasound and patient shoulder shows the patient does have a new rotator cuff tear noted.  Patient's supraspinatus has what appears to be split tear and recent bleeding noted with hyperechoic changes.  Patient also has what appears to be a partial tear of the subscapularis. Impression: Rotator cuff tear  97110; 15 additional minutes spent for Therapeutic exercises as stated in above notes.  This included exercises focusing on stretching, strengthening, with significant focus on eccentric aspects.   Long term goals include an improvement in range of motion, strength, endurance as well as avoiding reinjury. Patient's frequency would include in 1-2 times a day, 3-5 times a week for a duration of 6-12 weeks. Shoulder Exercises that included:  Basic scapular stabilization to include adduction and depression of scapula Scaption, focusing on proper movement and good control Internal and External rotation utilizing  a theraband, with elbow tucked at side entire time Rows with theraband  Which was given   Proper technique shown and discussed handout in great detail with ATC.  All questions were discussed and answered.     Impression and Recommendations:     This case required medical decision making of moderate complexity. The above documentation has been reviewed and is accurate and complete Lyndal Pulley, DO       Note: This dictation was prepared with Dragon dictation along with smaller phrase technology. Any transcriptional errors that result from this process are unintentional.

## 2019-01-09 ENCOUNTER — Encounter: Payer: Self-pay | Admitting: Family Medicine

## 2019-01-09 DIAGNOSIS — M75102 Unspecified rotator cuff tear or rupture of left shoulder, not specified as traumatic: Secondary | ICD-10-CM | POA: Insufficient documentation

## 2019-01-09 NOTE — Assessment & Plan Note (Signed)
Patient does have a rotator cuff tear.  Patient given exercises, declined injection, we will hold on formal physical therapy secondary to coronavirus.  Discussed nitroglycerin patch and warned of potential side effects.  Has done this in the past for another problem.  Discussed icing regimen.  Follow-up with me again in 4 weeks to further evaluate

## 2019-02-06 ENCOUNTER — Encounter: Payer: Self-pay | Admitting: Family Medicine

## 2019-02-06 ENCOUNTER — Ambulatory Visit: Payer: Self-pay

## 2019-02-06 ENCOUNTER — Other Ambulatory Visit: Payer: Self-pay

## 2019-02-06 ENCOUNTER — Ambulatory Visit (INDEPENDENT_AMBULATORY_CARE_PROVIDER_SITE_OTHER): Payer: Managed Care, Other (non HMO) | Admitting: Family Medicine

## 2019-02-06 VITALS — BP 160/80 | HR 118 | Ht 68.0 in | Wt 180.0 lb

## 2019-02-06 DIAGNOSIS — S46012A Strain of muscle(s) and tendon(s) of the rotator cuff of left shoulder, initial encounter: Secondary | ICD-10-CM

## 2019-02-06 DIAGNOSIS — G8929 Other chronic pain: Secondary | ICD-10-CM

## 2019-02-06 DIAGNOSIS — M25512 Pain in left shoulder: Secondary | ICD-10-CM

## 2019-02-06 MED ORDER — NITROGLYCERIN 0.2 MG/HR TD PT24: MEDICATED_PATCH | TRANSDERMAL | 0 refills | Status: DC

## 2019-02-06 NOTE — Patient Instructions (Addendum)
Happy Holiday's   Continue Nitroglycerine  Sent refill nitroglycerin  Pennsaid samples given  Starting at 1st of the year 50% weight increase 25% a week  Return in 4 weeks

## 2019-02-06 NOTE — Progress Notes (Signed)
[facility] [address] [place], [place] [address] Phone: [phone] Subjective:   I, Kandace Blitz, am serving as a scribe for Dr. Hulan Saas.  This visit occurred during the SARS-CoV-2 public health emergency.  Safety protocols were in place, including screening questions prior to the visit, additional usage of staff PPE, and extensive cleaning of exam room while observing appropriate contact time as indicated for disinfecting solutions.   I'm seeing this patient by the request  of:    CC: Left shoulder pain follow-up  WCH:[id number]   01/08/2019 Patient does have a rotator cuff tear.  Patient given exercises, declined injection, we will hold on formal physical therapy secondary to coronavirus.  Discussed nitroglycerin patch and warned of potential side effects.  Has done this in the past for another problem.  Discussed icing regimen.  Follow-up with me again in 4 weeks to further evaluate  02/06/2019 Ade Stmarie Archuletta is a 58 y.o. male coming in with complaint of left shoulder pain. Patient states he is much better. Still some pain.  Patient has made approximately 80% improvement he thinks.  Patient thinks regular daily activities have become much more comfortable.  Nitroglycerin no significant side effects at the moment     No past medical history on file. Past Surgical History:  Procedure Laterality Date  . RHINOPLASTY     Social History   Socioeconomic History  . Marital status: Married    Spouse name: Not on file  . Number of children: Not on file  . Years of education: Not on file  . Highest education level: Not on file  Occupational History  . Not on file  Tobacco Use  . Smoking status: Never Smoker  Substance and Sexual Activity  . Alcohol use: Not on file  . Drug use: Not on file  . Sexual activity: Not on file  Other Topics Concern  . Not on file  Social History Narrative  . Not on file   Social Determinants of Health    Financial Resource Strain:   . Difficulty of Paying Living Expenses: Not on file  Food Insecurity:   . Worried About Charity fundraiser in the Last Year: Not on file  . Ran Out of Food in the Last Year: Not on file  Transportation Needs:   . Lack of Transportation (Medical): Not on file  . Lack of Transportation (Non-Medical): Not on file  Physical Activity:   . Days of Exercise per Week: Not on file  . Minutes of Exercise per Session: Not on file  Stress:   . Feeling of Stress : Not on file  Social Connections:   . Frequency of Communication with Friends and Family: Not on file  . Frequency of Social Gatherings with Friends and Family: Not on file  . Attends Religious Services: Not on file  . Active Member of Clubs or Organizations: Not on file  . Attends Archivist Meetings: Not on file  . Marital Status: Not on file   Allergies  Allergen Reactions  . Penicillins    No family history on file.   Current Outpatient Medications (Cardiovascular):  .  nitroGLYCERIN (NITRO-DUR) 0.2 mg/hr patch, Apply 1/4 of a patch to skin once daily.        Past medical history, social, surgical and family history all reviewed in electronic medical record.  No pertanent information unless stated regarding to the chief complaint.   Review of Systems:  No headache, visual  changes, nausea, vomiting, diarrhea, constipation, dizziness, abdominal pain, skin rash, fevers, chills, night sweats, weight loss, swollen lymph nodes, body aches, joint swelling, muscle aches, chest pain, shortness of breath, mood changes.   Objective  Blood pressure (!) 160/80, pulse (!) 118, height 5\' 8"  (1.727 m), weight 180 lb (81.6 kg), SpO2 97 %.   General: No apparent distress alert and oriented x3 mood and affect normal, dressed appropriately.  HEENT: Pupils equal, extraocular movements intact  Respiratory: Patient's speak in full sentences and does not appear short of breath  Cardiovascular: No  lower extremity edema, non tender, no erythema  Skin: Warm dry intact with no signs of infection or rash on extremities or on axial skeleton.  Abdomen: Soft nontender  Neuro: Cranial nerves II through XII are intact, neurovascularly intact in all extremities with 2+ DTRs and 2+ pulses.  Lymph: No lymphadenopathy of posterior or anterior cervical chain or axillae bilaterally.  Gait normal with good balance and coordination.  MSK:  Non tender with full range of motion and good stability and symmetric strength and tone of  elbows, wrist, hip, knee and ankles bilaterally.  Shoulder: Left Inspection reveals no abnormalities, atrophy or asymmetry. Palpation is normal with no tenderness over AC joint or bicipital groove. ROM is full in all planes. Rotator cuff strength normal throughout. Positive impingement noted Speeds and Yergason's tests normal. No labral pathology noted with negative Obrien's, negative clunk and good stability. Normal scapular function observed. No painful arc and no drop arm sign. No apprehension sign Contralateral shoulder unremarkable  Limited musculoskeletal ultrasound was performed and interpreted by  Limited ultrasound shows the patient does have still some mild intrasubstance tearing noted but does have good scar tissue formation.  Significant decrease in hypoechoic changes.  Overall interval healing noted. Impression: Interval healing of the rotator cuff tear      Impression and Recommendations:     This case required medical decision making of moderate complexity. The above documentation has been reviewed and is accurate and complete Judi Saa, DO       Note: This dictation was prepared with Dragon dictation along with smaller phrase technology. Any transcriptional errors that result from this process are unintentional.

## 2019-02-06 NOTE — Assessment & Plan Note (Signed)
Patient is doing significantly better at this time.  We do see some scar tissue formation noted.  Discussed icing regimen, home exercises, which activities to do which wants to avoid.  Patient is to increase activity slowly over the course of next several weeks.  Patient will follow-up again in 4 to 8 weeks

## 2019-03-07 ENCOUNTER — Ambulatory Visit (INDEPENDENT_AMBULATORY_CARE_PROVIDER_SITE_OTHER): Payer: Managed Care, Other (non HMO) | Admitting: Family Medicine

## 2019-03-07 ENCOUNTER — Other Ambulatory Visit: Payer: Self-pay

## 2019-03-07 ENCOUNTER — Ambulatory Visit (INDEPENDENT_AMBULATORY_CARE_PROVIDER_SITE_OTHER): Payer: Managed Care, Other (non HMO)

## 2019-03-07 ENCOUNTER — Encounter: Payer: Self-pay | Admitting: Family Medicine

## 2019-03-07 VITALS — BP 140/80 | HR 84 | Ht 68.0 in | Wt 176.0 lb

## 2019-03-07 DIAGNOSIS — S46012A Strain of muscle(s) and tendon(s) of the rotator cuff of left shoulder, initial encounter: Secondary | ICD-10-CM

## 2019-03-07 DIAGNOSIS — M25512 Pain in left shoulder: Secondary | ICD-10-CM

## 2019-03-07 DIAGNOSIS — G8929 Other chronic pain: Secondary | ICD-10-CM

## 2019-03-07 MED ORDER — NITROGLYCERIN 0.2 MG/HR TD PT24: MEDICATED_PATCH | TRANSDERMAL | 0 refills | Status: DC

## 2019-03-07 NOTE — Progress Notes (Signed)
[place] [address] [place] Phone: [phone] Subjective:   I Corey Donaldson am serving as a Education administrator for Dr. Hulan Saas.  This visit occurred during the SARS-CoV-2 public health emergency.  Safety protocols were in place, including screening questions prior to the visit, additional usage of staff PPE, and extensive cleaning of exam room while observing appropriate contact time as indicated for disinfecting solutions.   I'm seeing this patient by the request  of:  Patient, No Pcp Per  CC: Left shoulder pain  GLO:[id number]   02/06/2019 Patient is doing significantly better at this time.  We do see some scar tissue formation noted.  Discussed icing regimen, home exercises, which activities to do which wants to avoid.  Patient is to increase activity slowly over the course of next several weeks.  Patient will follow-up again in 4 to 8 weeks  03/07/2019 Corey Donaldson is a 60 y.o. male coming in with complaint of left shoulder pain. Patient states he is not 100% but he is improving.  Patient states full range of motion, no pain at night, still having some mild discomfort though when working out.  Has been doing some push-ups on a regular basis again.  Denies any radiation of the pain.    No past medical history on file. Past Surgical History:  Procedure Laterality Date  . RHINOPLASTY     Social History   Socioeconomic History  . Marital status: Married    Spouse name: Not on file  . Number of children: Not on file  . Years of education: Not on file  . Highest education level: Not on file  Occupational History  . Not on file  Tobacco Use  . Smoking status: Never Smoker  Substance and Sexual Activity  . Alcohol use: Not on file  . Drug use: Not on file  . Sexual activity: Not on file  Other Topics Concern  . Not on file  Social History Narrative  . Not on file   Social Determinants of Health   Financial Resource  Strain:   . Difficulty of Paying Living Expenses: Not on file  Food Insecurity:   . Worried About Charity fundraiser in the Last Year: Not on file  . Ran Out of Food in the Last Year: Not on file  Transportation Needs:   . Lack of Transportation (Medical): Not on file  . Lack of Transportation (Non-Medical): Not on file  Physical Activity:   . Days of Exercise per Week: Not on file  . Minutes of Exercise per Session: Not on file  Stress:   . Feeling of Stress : Not on file  Social Connections:   . Frequency of Communication with Friends and Family: Not on file  . Frequency of Social Gatherings with Friends and Family: Not on file  . Attends Religious Services: Not on file  . Active Member of Clubs or Organizations: Not on file  . Attends Archivist Meetings: Not on file  . Marital Status: Not on file   Allergies  Allergen Reactions  . Penicillins    No family history on file.   Current Outpatient Medications (Cardiovascular):  .  nitroGLYCERIN (NITRO-DUR) 0.2 mg/hr patch, Apply 1/4 of a patch to skin once daily.        Past medical history, social, surgical and family history all reviewed in electronic medical record.  No pertanent information unless stated regarding to the chief complaint.  Review of Systems:  No headache, visual changes, nausea, vomiting, diarrhea, constipation, dizziness, abdominal pain, skin rash, fevers, chills, night sweats, weight loss, swollen lymph nodes, body aches, joint swelling, chest pain, shortness of breath, mood changes. POSITIVE muscle aches  Objective  Blood pressure 140/80, pulse 84, height 5\' 8"  (1.727 m), weight 176 lb (79.8 kg), SpO2 97 %.   General: No apparent distress alert and oriented x3 mood and affect normal, dressed appropriately.  HEENT: Pupils equal, extraocular movements intact  Respiratory: Patient's speak in full sentences and does not appear short of breath  Cardiovascular: No lower extremity edema, non  tender, no erythema  Skin: Warm dry intact with no signs of infection or rash on extremities or on axial skeleton.  Abdomen: Soft nontender  Neuro: Cranial nerves II through XII are intact, neurovascularly intact in all extremities with 2+ DTRs and 2+ pulses.  Lymph: No lymphadenopathy of posterior or anterior cervical chain or axillae bilaterally.  Gait normal with good balance and coordination.  MSK:  Non tender with full range of motion and good stability and symmetric strength and tone of shoulders, elbows, wrist, hip, knee and ankles bilaterally.  Left shoulder exam shows the patient does have full range of motion.  5 out of 5 strength of rotator cuff noted.  Very mild impingement with otherwise unremarkable  Limited musculoskeletal ultrasound was performed and interpreted by Juanetta Gosling  Limited ultrasound shows the patient does have interval healing noted at the moment.  Still some very mild scar tissue formation of the supraspinatus noted.  Otherwise seems to be doing relatively well. Impression: Interval healing of a rotator cuff tear   Impression and Recommendations:      The above documentation has been reviewed and is accurate and complete Judi Saa, DO       Note: This dictation was prepared with Dragon dictation along with smaller phrase technology. Any transcriptional errors that result from this process are unintentional.

## 2019-03-07 NOTE — Assessment & Plan Note (Signed)
Patient shows interval healing on the ultrasound again.  We discussed icing regimen and home exercise, which activities to do which wants to avoid.  Patient will increase activity slowly over the course the next several weeks.  Follow-up with me again as needed as long as patient continues to improve

## 2019-03-07 NOTE — Patient Instructions (Addendum)
Good to see you [place] [address] [place] [employer] is good as well  See me again if you need me

## 2020-11-19 ENCOUNTER — Encounter: Payer: Self-pay | Admitting: Family Medicine

## 2020-11-24 NOTE — Progress Notes (Signed)
[name] [address] [place] [address] Phone: [phone] Subjective:   Corey Donaldson, am serving as a scribe for Dr. Antoine Primas. This visit occurred during the SARS-CoV-2 public health emergency.  Safety protocols were in place, including screening questions prior to the visit, additional usage of staff PPE, and extensive cleaning of exam room while observing appropriate contact time as indicated for disinfecting solutions.   I'm seeing this patient by the request  of:  Patient, No Pcp Per (Inactive)  CC: Right foot pain  KCM:[id number]  Corey Donaldson is a 60 y.o. male coming in with complaint of R plantar fasciitis for past 5 months. Last seen in January of 2021 for L shoulder pain. Patient states that pain occurring around the heel and the bottom of the foot. Pain moves around. Stretching the calf and rolling the foot. Has history of similar symptoms. Wearing walkfit orthotics with the highest arch support. Makes his foot tender as he mentions having flat feet. Wears OOFOS in the house.      No past medical history on file. Past Surgical History:  Procedure Laterality Date   RHINOPLASTY     Social History   Socioeconomic History   Marital status: Married    Spouse name: Not on file   Number of children: Not on file   Years of education: Not on file   Highest education level: Not on file  Occupational History   Not on file  Tobacco Use   Smoking status: Never   Smokeless tobacco: Not on file  Substance and Sexual Activity   Alcohol use: Not on file   Drug use: Not on file   Sexual activity: Not on file  Other Topics Concern   Not on file  Social History Narrative   Not on file   Social Determinants of Health   Financial Resource Strain: Not on file  Food Insecurity: Not on file  Transportation Needs: Not on file  Physical Activity: Not on file  Stress: Not on file  Social Connections: Not on file   Allergies   Allergen Reactions   Penicillins    No family history on file.   Current Outpatient Medications (Cardiovascular):    nitroGLYCERIN (NITRO-DUR) 0.2 mg/hr patch, Apply 1/4 of a patch to skin once daily.      Reviewed prior external information including notes and imaging from  primary care provider As well as notes that were available from care everywhere and other healthcare systems.  Past medical history, social, surgical and family history all reviewed in electronic medical record.  No pertanent information unless stated regarding to the chief complaint.   Review of Systems:  No headache, visual changes, nausea, vomiting, diarrhea, constipation, dizziness, abdominal pain, skin rash, fevers, chills, night sweats, weight loss, swollen lymph nodes, body aches, joint swelling, chest pain, shortness of breath, mood changes. POSITIVE muscle aches  Objective  Blood pressure (!) 144/92, pulse (!) 101, height 5\' 8"  (1.727 m), weight 182 lb (82.6 kg), SpO2 98 %.   General: No apparent distress alert and oriented x3 mood and affect normal, dressed appropriately.  HEENT: Pupils equal, extraocular movements intact  Respiratory: Patient's speak in full sentences and does not appear short of breath  Cardiovascular: No lower extremity edema, non tender, no erythema  Gait normal with good balance and coordination.  MSK: Patient has had more of a tenderness noted over the medial calcaneal area in the plantar aspect of the calcaneal area.  No significant swelling noted.  Mild tightness noted in the posterior cord.  Limited muscular skeletal ultrasound was performed and interpreted by Antoine Primas, M  Limited ultrasound of patient's plantar fascia shows some very mild hypoechoic changes mostly of the lateral and medial fibers.  Not as much in the most medial aspect.  No cortical irregularity noted of the calcaneal area.  Patient does have what appears to be a mild calcific bursitis noted in the  retrocalcaneal area. Impression: Mild to moderate plan fasciitis with a calcific bursitis in the retrocalcaneal area   Impression and Recommendations:     The above documentation has been reviewed and is accurate and complete Judi Saa, DO

## 2020-11-25 ENCOUNTER — Other Ambulatory Visit: Payer: Self-pay

## 2020-11-25 ENCOUNTER — Ambulatory Visit (INDEPENDENT_AMBULATORY_CARE_PROVIDER_SITE_OTHER): Payer: Managed Care, Other (non HMO) | Admitting: Family Medicine

## 2020-11-25 ENCOUNTER — Encounter: Payer: Self-pay | Admitting: Family Medicine

## 2020-11-25 ENCOUNTER — Ambulatory Visit: Payer: Self-pay

## 2020-11-25 VITALS — BP 144/92 | HR 101 | Ht 68.0 in | Wt 182.0 lb

## 2020-11-25 DIAGNOSIS — M722 Plantar fascial fibromatosis: Secondary | ICD-10-CM | POA: Diagnosis not present

## 2020-11-25 DIAGNOSIS — M79671 Pain in right foot: Secondary | ICD-10-CM

## 2020-11-25 MED ORDER — NITROGLYCERIN 0.2 MG/HR TD PT24: MEDICATED_PATCH | TRANSDERMAL | 0 refills | Status: DC

## 2020-11-25 NOTE — Patient Instructions (Addendum)
Do prescribed exercises at least 3x a week Nitroglycerin Protocol   Apply 1/4 nitroglycerin patch to affected area daily.  Change position of patch within the affected area every 24 hours.  You may experience a headache during the first 1-2 weeks of using the patch, these should subside.  If you experience headaches after beginning nitroglycerin patch treatment, you may take your preferred over the counter pain reliever.  Another side effect of the nitroglycerin patch is skin irritation or rash related to patch adhesive.  Please notify our office if you develop more severe headaches or rash, and stop the patch.  Tendon healing with nitroglycerin patch may require 12 to 24 weeks depending on the extent of injury.  Men should not use if taking Viagra, Cialis, or Levitra.   Do not use if you have migraines or rosacea.  Keep using recovery sandal maybe look into [place] is still good See you again in 5-6 weeks to make sure we are making progress

## 2020-11-25 NOTE — Assessment & Plan Note (Signed)
Started nitroglycerin today.  Tolerated t it in the past.  Did discuss not taking it with any type Cialis or Viagra.  Patient is going to do the home exercises.  I did discuss that this does not seem to make significant difference we do need to consider the possibility of injection.  Work with Event organiser to learn home exercises and follow-up again in 4 to 8 weeks

## 2020-12-21 ENCOUNTER — Ambulatory Visit: Payer: Managed Care, Other (non HMO) | Admitting: Family Medicine

## 2020-12-29 NOTE — Progress Notes (Signed)
[name] [address] [place] [address] Phone: [phone] Subjective:   Corey Donaldson, am serving as a scribe for Dr. Antoine Primas. This visit occurred during the SARS-CoV-2 public health emergency.  Safety protocols were in place, including screening questions prior to the visit, additional usage of staff PPE, and extensive cleaning of exam room while observing appropriate contact time as indicated for disinfecting solutions.   I'm seeing this patient by the request  of:  Patient, No Pcp Per (Inactive)  CC: Foot pain follow-up  [id number]  11/25/2020 Started nitroglycerin today.  Tolerated t it in the past.  Did discuss not taking it with any type Cialis or Viagra.  Patient is going to do the home exercises.  I did discuss that this does not seem to make significant difference we do need to consider the possibility of injection.  Work with Event organiser to learn home exercises and follow-up again in 4 to 8 weeks  Update 12/30/2020 Corey Donaldson is a 60 y.o. male coming in with complaint of R foot pain. Patient states that he did use nitro patches but is out of prescription. Pain worse in morning and when walking a lot. Wearing supportive shoes. Feels that he is improving.       No past medical history on file. Past Surgical History:  Procedure Laterality Date   RHINOPLASTY     Social History   Socioeconomic History   Marital status: Married    Spouse name: Not on file   Number of children: Not on file   Years of education: Not on file   Highest education level: Not on file  Occupational History   Not on file  Tobacco Use   Smoking status: Never   Smokeless tobacco: Not on file  Substance and Sexual Activity   Alcohol use: Not on file   Drug use: Not on file   Sexual activity: Not on file  Other Topics Concern   Not on file  Social History Narrative   Not on file   Social Determinants of Health   Financial  Resource Strain: Not on file  Food Insecurity: Not on file  Transportation Needs: Not on file  Physical Activity: Not on file  Stress: Not on file  Social Connections: Not on file   Allergies  Allergen Reactions   Penicillins    No family history on file.   Current Outpatient Medications (Cardiovascular):    nitroGLYCERIN (NITRO-DUR) 0.2 mg/hr patch, Apply 1/4 of a patch to skin once daily.       Review of Systems:  No headache, visual changes, nausea, vomiting, diarrhea, constipation, dizziness, abdominal pain, skin rash, fevers, chills, night sweats, weight loss, swollen lymph nodes, body aches, joint swelling, chest pain, shortness of breath, mood changes. POSITIVE muscle aches  Objective  Blood pressure (!) 148/94, pulse 100, height 5\' 8"  (1.727 m), weight 179 lb (81.2 kg), SpO2 99 %.   General: No apparent distress alert and oriented x3 mood and affect normal, dressed appropriately.  HEENT: Pupils equal, extraocular movements intact  Respiratory: Patient's speak in full sentences and does not appear short of breath  Cardiovascular: No lower extremity edema, non tender, no erythema  Gait normal with good balance and coordination.  MSK: Patient has very minimal pain on inspection today.  Patient has very mild tenderness to palpation over the calcaneal plantar aspect.  Patient posterior cord is less tension than previous exam.  Neurovascular intact distally.  Limited muscular skeletal ultrasound was performed and interpreted by Antoine Primas, M  Limited ultrasound of patient's calcaneal area that does show significant decrease in size of the plantar fascia from previous exam.  Also significant decrease in the hypoechoic changes that were consistent in the soft tissue previously.  Does have an area on the lateral calcaneal area that could be potential cortical irregularity with increasing in Doppler flow.  Otherwise fairly unremarkable. Impression: Interval improvement of the  plantar fasciitis   Impression and Recommendations:     The above documentation has been reviewed and is accurate and complete Judi Saa, DO

## 2020-12-30 ENCOUNTER — Encounter: Payer: Self-pay | Admitting: Family Medicine

## 2020-12-30 ENCOUNTER — Ambulatory Visit (INDEPENDENT_AMBULATORY_CARE_PROVIDER_SITE_OTHER): Payer: Managed Care, Other (non HMO) | Admitting: Family Medicine

## 2020-12-30 ENCOUNTER — Ambulatory Visit: Payer: Self-pay

## 2020-12-30 ENCOUNTER — Other Ambulatory Visit: Payer: Self-pay

## 2020-12-30 VITALS — BP 148/94 | HR 100 | Ht 68.0 in | Wt 179.0 lb

## 2020-12-30 DIAGNOSIS — M722 Plantar fascial fibromatosis: Secondary | ICD-10-CM

## 2020-12-30 DIAGNOSIS — M79671 Pain in right foot: Secondary | ICD-10-CM

## 2020-12-30 MED ORDER — NITROGLYCERIN 0.2 MG/HR TD PT24: MEDICATED_PATCH | TRANSDERMAL | 0 refills | Status: DC

## 2020-12-30 NOTE — Assessment & Plan Note (Signed)
Improvement noted at this time.  Pain seems to be more lateral that I think is more secondary to compensation.  Does have a mild irregularity noted of the lateral calcaneal area that could be consistent with a stress reaction but think it is more once again secondary to the irritation in the area.  Patient will continue with conservative therapy as well as the nitroglycerin patches.  Discussed with patient about the increase in dosing he has been doing on his own and will monitor for any side effects.  Follow-up with me again 6 to 8 weeks.

## 2020-12-30 NOTE — Patient Instructions (Addendum)
Refill nitro Full patch daily Continue exercises Ok to start walking progression after [place] Day Go shopping for new shoes: [employer] See me in 6 weeks

## 2021-01-04 ENCOUNTER — Other Ambulatory Visit: Payer: Self-pay

## 2021-01-04 ENCOUNTER — Telehealth: Payer: Self-pay | Admitting: Family Medicine

## 2021-01-04 MED ORDER — NITROGLYCERIN 0.2 MG/HR TD PT24: MEDICATED_PATCH | TRANSDERMAL | 0 refills | Status: AC

## 2021-01-04 NOTE — Telephone Encounter (Signed)
Rx updated.

## 2021-01-04 NOTE — Telephone Encounter (Signed)
Patient called in regards to the prescription that was sent in for the Nitroglycerine Patches.  He said that he has been using 1 (one) whole patch per day. It was sent in for 1/4 of a patch so when it was filled, they only gave him 8 patches. He asked that the prescription be corrected and resent.  Per Dr Katrinka Blazing is should be 1/2 of a patch per day...  Please advise.

## 2021-02-09 NOTE — Progress Notes (Signed)
[name] [address] [place] [address] Phone: [phone] Subjective:   [name], am serving as a scribe for Dr. Antoine Primas. This visit occurred during the SARS-CoV-2 public health emergency.  Safety protocols were in place, including screening questions prior to the visit, additional usage of staff PPE, and extensive cleaning of exam room while observing appropriate contact time as indicated for disinfecting solutions.   I'm seeing this patient by the request  of:  Patient, No Pcp Per (Inactive)  CC: Right foot pain follow-up  [id number]  12/30/2020 Improvement noted at this time.  Pain seems to be more lateral that I think is more secondary to compensation.  Does have a mild irregularity noted of the lateral calcaneal area that could be consistent with a stress reaction but think it is more once again secondary to the irritation in the area.  Patient will continue with conservative therapy as well as the nitroglycerin patches.  Discussed with patient about the increase in dosing he has been doing on his own and will monitor for any side effects.  Follow-up with me again 6 to 8 weeks.  Update 02/10/2021 ANGELINA NEECE is a 60 y.o. male coming in with complaint of R foot pain. Patient has been using nitro patches.  Patient was making improvement at last interval.  There was a concern for potential stress reaction of the lateral calcaneal area and including the plantar fasciitis.  Patient states that his pain is worst in the morning and after sitting for prolonged periods. Pain continues to be on posterior heel and plantar aspect of heel.       No past medical history on file. Past Surgical History:  Procedure Laterality Date   RHINOPLASTY     Social History   Socioeconomic History   Marital status: Married    Spouse name: Not on file   Number of children: Not on file   Years of education: Not on file   Highest education level:  Not on file  Occupational History   Not on file  Tobacco Use   Smoking status: Never   Smokeless tobacco: Not on file  Substance and Sexual Activity   Alcohol use: Not on file   Drug use: Not on file   Sexual activity: Not on file  Other Topics Concern   Not on file  Social History Narrative   Not on file   Social Determinants of Health   Financial Resource Strain: Not on file  Food Insecurity: Not on file  Transportation Needs: Not on file  Physical Activity: Not on file  Stress: Not on file  Social Connections: Not on file   Allergies  Allergen Reactions   Penicillins    No family history on file.   Current Outpatient Medications (Cardiovascular):    nitroGLYCERIN (NITRO-DUR) 0.2 mg/hr patch, Apply 1 patch daily to affected area.       Reviewed prior external information including notes and imaging from  primary care provider As well as notes that were available from care everywhere and other healthcare systems.  Past medical history, social, surgical and family history all reviewed in electronic medical record.  No pertanent information unless stated regarding to the chief complaint.   Review of Systems:  No headache, visual changes, nausea, vomiting, diarrhea, constipation, dizziness, abdominal pain, skin rash, fevers, chills, night sweats, weight loss, swollen lymph nodes, body aches, joint swelling, chest pain, shortness of breath, mood changes. POSITIVE muscle aches  Objective  Blood pressure 132/86, pulse (!) 107, height 5\' 8"  (1.727 m), weight 181 lb (82.1 kg), SpO2 95 %.   General: No apparent distress alert and oriented x3 mood and affect normal, dressed appropriately.  HEENT: Pupils equal, extraocular movements intact  Respiratory: Patient's speak in full sentences and does not appear short of breath  Cardiovascular: No lower extremity edema, non tender, no erythema  Gait normal with good balance and coordination.  MSK: Foot exam shows right foot  still is tender to palpation more over the calcaneal area medially and laterally.  Patient does have mild tightness noted of the plantar fascia.  Nontender on the plantar aspect of the foot and more on the Achilles area.  Negative tarsal tunnel Tinel's sign.   Limited muscular skeletal ultrasound was performed and interpreted by , M  Patient does have enlargement noted of the plantar fascia but no true acute tear appreciated.  Patient did still have a mild cortical irregularity noted of the calcaneal on the lateral aspect.  Could be consistent with a stress reaction.  Patient does have some calcific changes noted in the retrocalcaneal area as well. Impression: Nonspecific calcific changes, enlargement of the plantar fascia, as well as irregularity of the lateral calcaneal area.   Impression and Recommendations:     The above documentation has been reviewed and is accurate and complete Antoine Primas, DO

## 2021-02-10 ENCOUNTER — Encounter: Payer: Self-pay | Admitting: Family Medicine

## 2021-02-10 ENCOUNTER — Ambulatory Visit: Payer: Self-pay

## 2021-02-10 ENCOUNTER — Ambulatory Visit (INDEPENDENT_AMBULATORY_CARE_PROVIDER_SITE_OTHER): Payer: Managed Care, Other (non HMO)

## 2021-02-10 ENCOUNTER — Other Ambulatory Visit: Payer: Self-pay

## 2021-02-10 ENCOUNTER — Ambulatory Visit (INDEPENDENT_AMBULATORY_CARE_PROVIDER_SITE_OTHER): Payer: Managed Care, Other (non HMO) | Admitting: Family Medicine

## 2021-02-10 VITALS — BP 132/86 | HR 107 | Ht 68.0 in | Wt 181.0 lb

## 2021-02-10 DIAGNOSIS — M79671 Pain in right foot: Secondary | ICD-10-CM | POA: Diagnosis not present

## 2021-02-10 DIAGNOSIS — M722 Plantar fascial fibromatosis: Secondary | ICD-10-CM

## 2021-02-10 NOTE — Patient Instructions (Addendum)
Xray today MRI ordered and call [phone] If no availability call us or write [place] and we will try another site Depending on findings we will discuss next steps as well.  If you figure out what the shot is, please write me and I will look into it  Happy New Year!

## 2021-02-10 NOTE — Assessment & Plan Note (Signed)
Patient has not made any significant improvement at this time.  On ultrasound patient continues to have a cortical irregularity noted of the lateral calcaneal area.  Patient also has calcific changes noted.  Patient's plantar fascial does have some hypoechoic changes and some increase in size but I do not see any true tearing appreciated.  Patient has failed all conservative therapy including physical therapy, nitroglycerin, orthotics, proper shoes and do feel that advanced imaging is warranted at this time.  Depending on findings this could change management.  If a cortical irregularity is found we will discuss the possibility of a bone stimulator with this being greater than 3 months of pain.  If more of the plantar fasciitis then we need to consider the possibility of different injections.  Follow-up again in 6 to 8 weeks.

## 2021-02-12 ENCOUNTER — Other Ambulatory Visit: Payer: Managed Care, Other (non HMO)

## 2021-02-12 ENCOUNTER — Telehealth: Payer: Self-pay | Admitting: Family Medicine

## 2021-02-12 NOTE — Telephone Encounter (Signed)
Patient called in regards to his MRI order. His insurance has denied it and is requiring a peer to peer.  He was scheduled to have it done today, but is going to cancel the appointment and wait until next year. He will be changing to [facility] in January.

## 2021-05-18 ENCOUNTER — Encounter: Payer: Self-pay | Admitting: Family Medicine

## 2021-05-20 ENCOUNTER — Encounter: Payer: Self-pay | Admitting: Family Medicine

## 2021-05-22 ENCOUNTER — Encounter: Payer: Self-pay | Admitting: Family Medicine

## 2021-05-22 ENCOUNTER — Ambulatory Visit
Admission: RE | Admit: 2021-05-22 | Discharge: 2021-05-22 | Disposition: A | Discharge status: Home / Self Care | Payer: 59 | Source: Ambulatory Visit | Attending: Family Medicine | Admitting: Family Medicine

## 2021-05-22 DIAGNOSIS — M79671 Pain in right foot: Secondary | ICD-10-CM

## 2021-05-23 ENCOUNTER — Encounter: Payer: Self-pay | Admitting: Family Medicine

## 2024-03-21 IMAGING — MR MR ANKLE*R* W/O CM
6 series · 40 of 40 positions shown · non-contrast
Comparison: Right foot radiographs 02/10/2021

CLINICAL DATA: Right ankle and heel pain for 9 months. Plantar
fasciitis. No previous relevant surgery.

EXAM:
MRI OF THE RIGHT ANKLE WITHOUT CONTRAST
TECHNIQUE: Multiplanar, multisequence MR imaging of the ankle was performed. No
intravenous contrast was administered.

[Series 4: T2 fat-sat · axial · 3.0mm · 0.50mm/px · z∈[-99,+25]mm · 7 of 33 slices shown (1 of 3)]
[im 1/33]
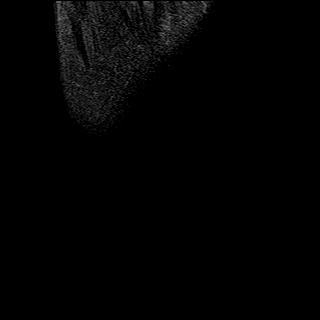
[im 6/33]
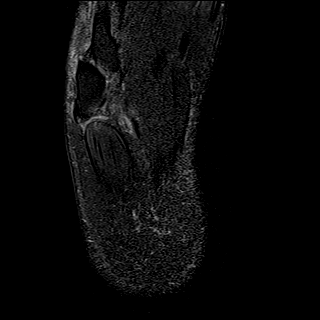
[im 11/33]
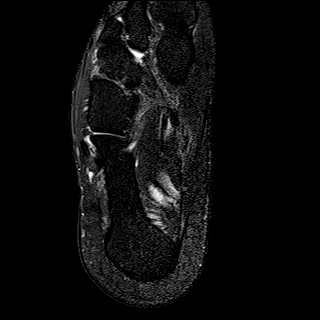
[im 17/33]
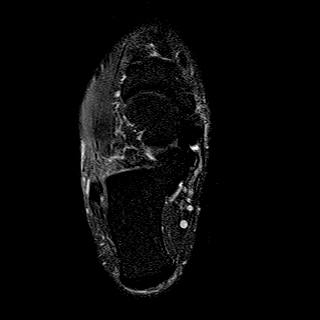
[im 22/33]
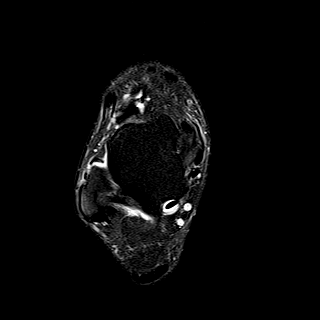
[im 27/33]
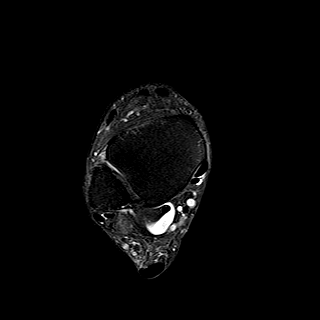
[im 33/33]
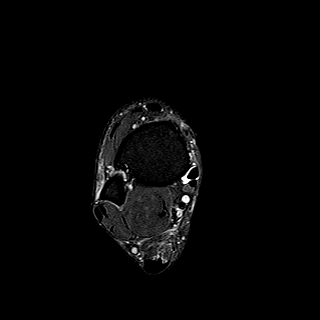

[Series 5: PD fat-sat · axial · 3.0mm · 0.50mm/px · z∈[-99,+25]mm · 7 of 33 slices shown]
[im 1/33]
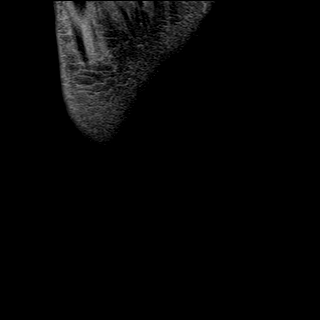
[im 6/33]
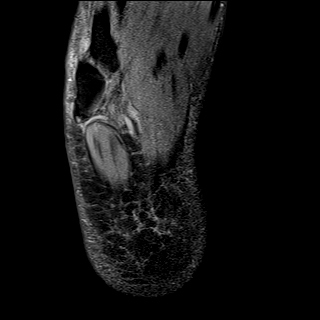
[im 11/33]
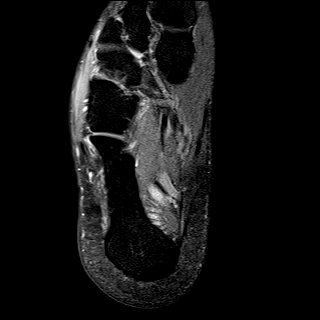
[im 17/33]
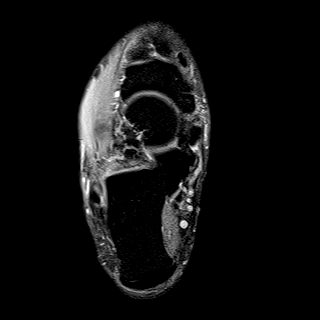
[im 22/33]
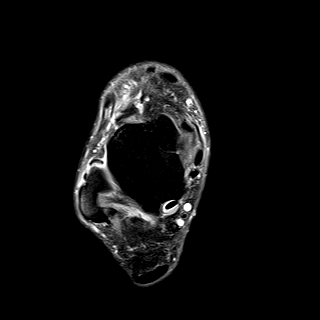
[im 27/33]
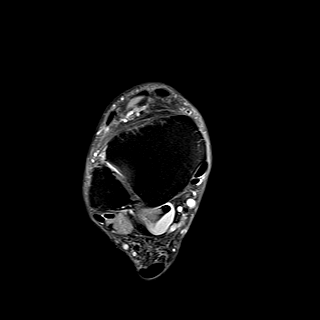
[im 33/33]
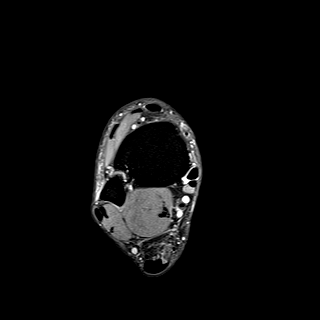

[Series 6: T1 · sagittal · 4.0mm · 0.56mm/px · 5 of 22 slices shown]
[im 1/22]
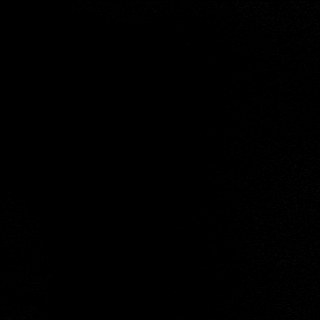
[im 6/22]
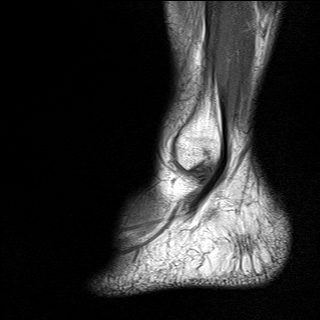
[im 11/22]
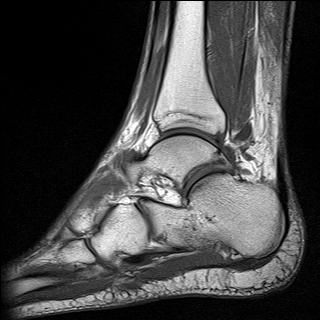
[im 16/22]
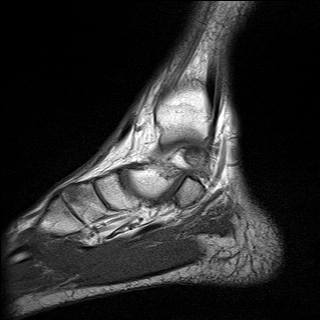
[im 22/22]
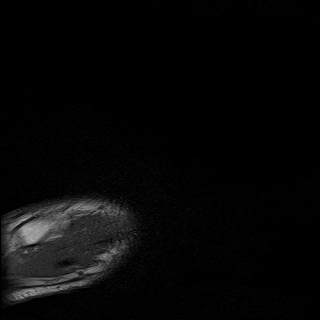

[Series 7: STIR · sagittal · 4.0mm · 0.35mm/px · 5 of 22 slices shown]
[im 1/22]
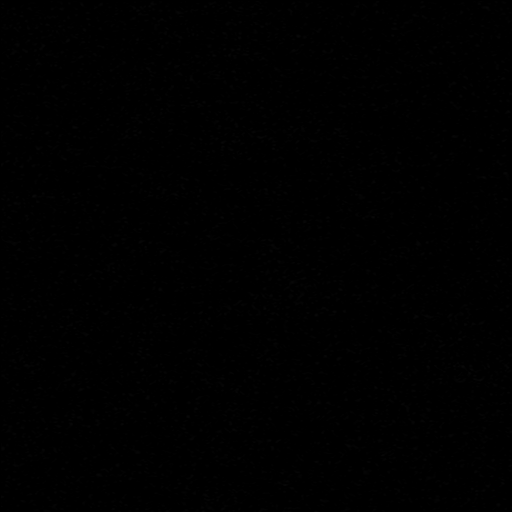
[im 6/22]
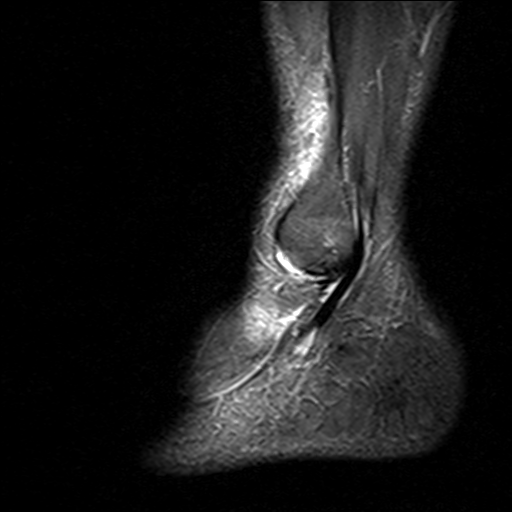
[im 11/22]
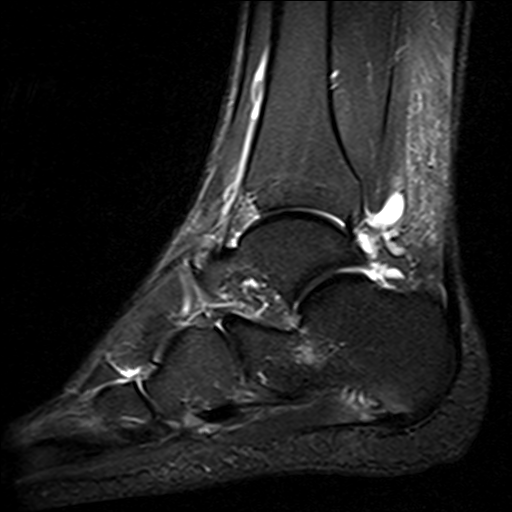
[im 16/22]
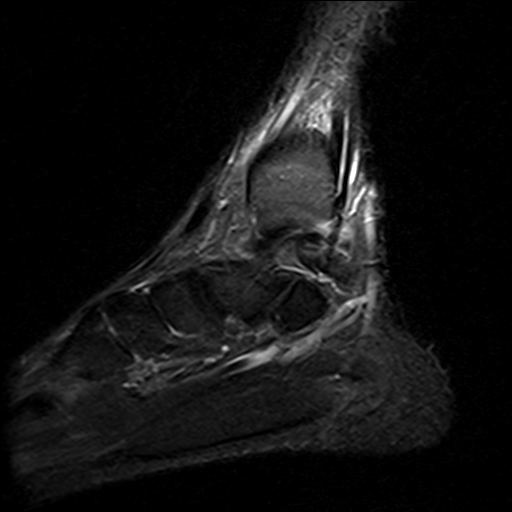
[im 22/22]
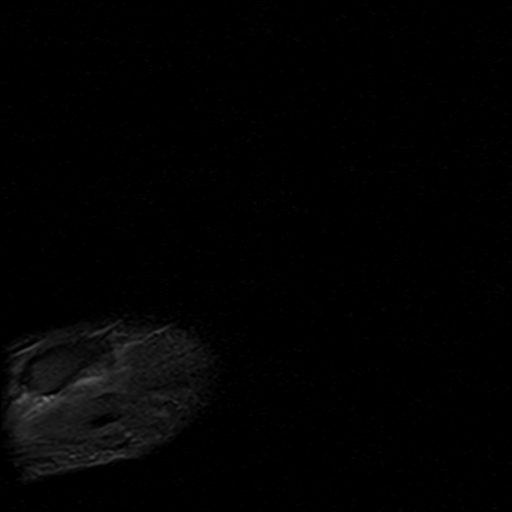

[Series 8: T2 fat-sat · coronal · 3.0mm · 0.50mm/px · 8 of 38 slices shown (2 of 3)]
[im 1/38]
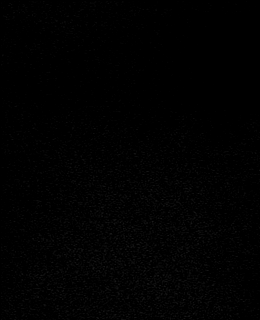
[im 6/38]
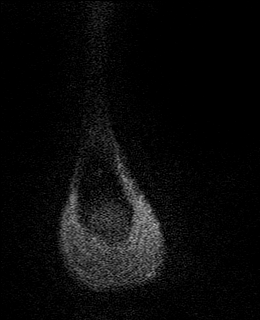
[im 11/38]
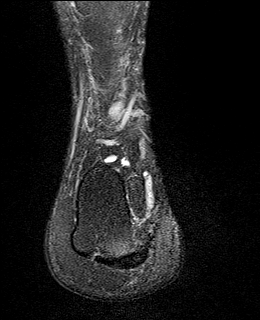
[im 16/38]
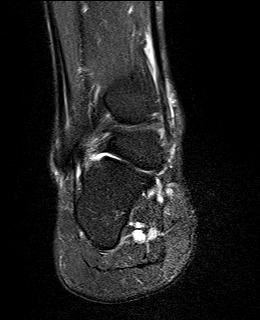
[im 22/38]
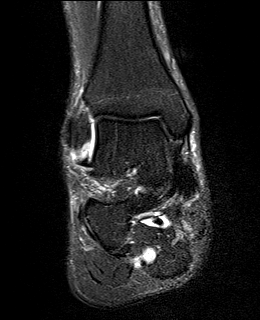
[im 27/38]
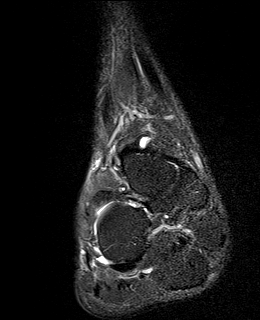
[im 32/38]
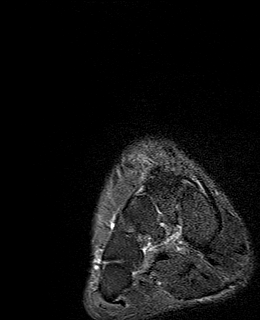
[im 38/38]
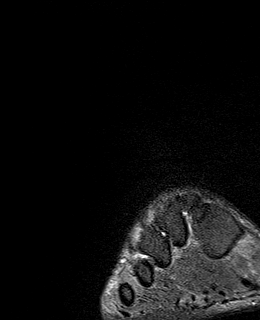

[Series 9: T2 fat-sat · coronal · 3.0mm · 0.62mm/px · 8 of 38 slices shown (3 of 3)]
[im 1/38]
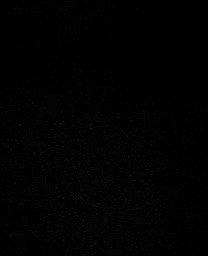
[im 6/38]
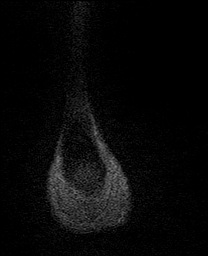
[im 11/38]
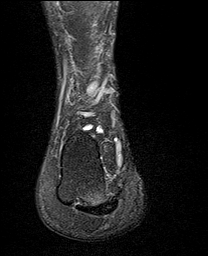
[im 16/38]
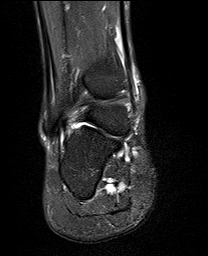
[im 22/38]
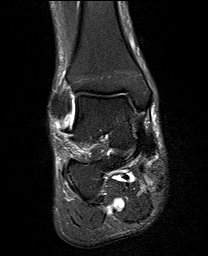
[im 27/38]
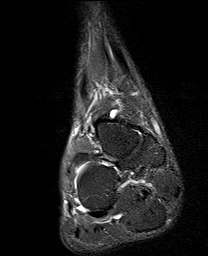
[im 32/38]
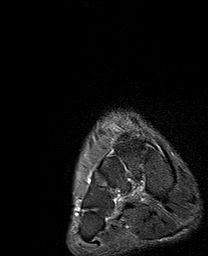
[im 38/38]
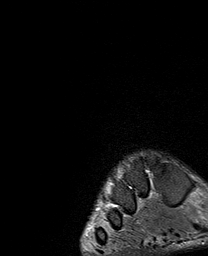

[40 of 40 positions shown; findings below may reference images not displayed]

FINDINGS: TENDONS

Peroneal: Intact and normally positioned.

Posteromedial: Intact and normally positioned. Prominent fluid
within the flexor hallucis longus tendon sheath is typically due to
communication with the ankle joint.

Anterior: Intact and normally positioned.

Achilles: Intact.

Plantar Fascia: Mild thickening of the central cord of the plantar
fascia with a small plantar calcaneal spur with associated reactive
marrow edema. No evidence of fascial rupture.

LIGAMENTS

Lateral: The anterior talofibular ligament is poorly visualized and
likely chronically torn. There is a probable associated well
corticated ossicle anterior to the distal fibula, without marrow
edema. The posterior talofibular and calcaneofibular ligaments
appear intact.The inferior tibiofibular ligaments appear intact.

Medial: The deltoid and visualized portions of the spring ligament
appear intact.

CARTILAGE AND BONES

Ankle Joint: Small ankle joint effusion. There is an osteochondral
lesion of the talar dome posteromedially with surrounding marrow
edema. There is irregularity of the overlying cartilage, best seen
on the coronal and sagittal images. There is suspicion of a small
unstable osteochondral fragment measuring 5 mm on coronal image
[DATE]. No displaced chondral fragments are seen. The tibial plafond
appears intact.

Subtalar Joints/Sinus Tarsi: Unremarkable.

Bones: No acute osseous findings. Within the calcaneal body, there
is a 1.4 cm marrow lesion with stippled T2 hyperintensity. This has
sclerotic margins on the radiographs and is probably an incidental
hemangioma.

Other: Mild edema in the pre Achilles fat without focal fluid
collection.
IMPRESSION: 1. Osteochondral lesion of the talar dome posteromedially with
suspicion of a small potentially unstable osteochondral fragment.
Recommend correlation with ankle radiographs.
2. Mild plantar fasciitis without evidence of fascial rupture.
3. Suspected chronic tear of the anterior talofibular ligament with
well corticated ossicle anterior to the distal fibula. No definite
acute ligamentous findings.
4. Probable incidental prominent fluid in the flexor hallucis longus
tendon sheath and incidental hemangioma within the calcaneal body.
